# Patient Record
Sex: Female | Born: 1950 | Race: White | Hispanic: No
Health system: Midwestern US, Academic
[De-identification: ages and names within clinical notes are randomized; demographics above are authoritative.]

---

## 2017-10-20 ENCOUNTER — Encounter: Admit: 2017-10-20 | Discharge: 2017-10-20 | Payer: MEDICARE

## 2017-10-20 DIAGNOSIS — R011 Cardiac murmur, unspecified: ICD-10-CM

## 2017-10-20 DIAGNOSIS — M1990 Unspecified osteoarthritis, unspecified site: Secondary | ICD-10-CM

## 2017-10-20 DIAGNOSIS — I96 Gangrene, not elsewhere classified: ICD-10-CM

## 2017-10-20 DIAGNOSIS — M7660 Achilles tendinitis, unspecified leg: ICD-10-CM

## 2017-10-20 DIAGNOSIS — Z8619 Personal history of other infectious and parasitic diseases: ICD-10-CM

## 2017-10-20 DIAGNOSIS — G8929 Other chronic pain: ICD-10-CM

## 2017-10-20 DIAGNOSIS — H6690 Otitis media, unspecified, unspecified ear: ICD-10-CM

## 2017-10-20 DIAGNOSIS — I10 Essential (primary) hypertension: ICD-10-CM

## 2017-10-20 DIAGNOSIS — B059 Measles without complication: ICD-10-CM

## 2017-10-20 DIAGNOSIS — M5430 Sciatica, unspecified side: ICD-10-CM

## 2017-10-20 DIAGNOSIS — T148XXA Other injury of unspecified body region, initial encounter: ICD-10-CM

## 2017-10-20 DIAGNOSIS — Z9109 Other allergy status, other than to drugs and biological substances: ICD-10-CM

## 2017-10-20 DIAGNOSIS — B019 Varicella without complication: ICD-10-CM

## 2017-10-20 DIAGNOSIS — J329 Chronic sinusitis, unspecified: ICD-10-CM

## 2017-10-20 DIAGNOSIS — M25476 Effusion, unspecified foot: ICD-10-CM

## 2017-10-20 DIAGNOSIS — E119 Type 2 diabetes mellitus without complications: ICD-10-CM

## 2017-10-20 DIAGNOSIS — M722 Plantar fascial fibromatosis: ICD-10-CM

## 2017-10-20 DIAGNOSIS — D259 Leiomyoma of uterus, unspecified: ICD-10-CM

## 2017-10-20 DIAGNOSIS — M2550 Pain in unspecified joint: ICD-10-CM

## 2017-10-20 DIAGNOSIS — B269 Mumps without complication: ICD-10-CM

## 2017-10-20 DIAGNOSIS — H6990 Unspecified Eustachian tube disorder, unspecified ear: ICD-10-CM

## 2018-09-01 ENCOUNTER — Encounter: Admit: 2018-09-01 | Discharge: 2018-09-02 | Payer: MEDICARE

## 2018-09-01 ENCOUNTER — Encounter: Admit: 2018-09-01 | Discharge: 2018-09-01 | Payer: MEDICARE

## 2018-09-01 DIAGNOSIS — R69 Illness, unspecified: Principal | ICD-10-CM

## 2018-09-01 MED ORDER — HEPARIN, PORCINE (PF) 5,000 UNIT/0.5 ML IJ SYRG
5000 [IU] | SUBCUTANEOUS | 0 refills | Status: DC
Start: 2018-09-01 — End: 2018-09-03

## 2018-09-02 ENCOUNTER — Encounter: Admit: 2018-09-02 | Discharge: 2018-09-02 | Payer: MEDICARE

## 2018-09-02 DIAGNOSIS — R0789 Other chest pain: Principal | ICD-10-CM

## 2018-09-02 LAB — MAGNESIUM
Lab: 2.1 mg/dL (ref 1.6–2.6)
Lab: 2.2 mg/dL — ABNORMAL LOW (ref 1.6–2.6)

## 2018-09-02 LAB — CBC AND DIFF
Lab: 11 10*3/uL — ABNORMAL HIGH (ref 4.5–11.0)
Lab: 0.1 10*3/uL (ref 0–0.20)
Lab: 0.2 10*3/uL (ref 0–0.45)
Lab: 0.8 10*3/uL (ref 0–0.80)
Lab: 1 % (ref 0–2)
Lab: 10 K/UL (ref 4.5–11.0)
Lab: 2 % (ref 0–5)
Lab: 2.6 10*3/uL (ref 1.0–4.8)
Lab: 6.4 10*3/uL (ref 1.8–7.0)

## 2018-09-02 LAB — BASIC METABOLIC PANEL
Lab: 0.6 mg/dL (ref >60)
Lab: 103 MMOL/L (ref 98–110)
Lab: 138 MMOL/L (ref 137–147)
Lab: 4 MMOL/L (ref 3.5–5.1)
Lab: 9.1 mg/dL (ref >60)
Lab: >60 mL/min (ref >60)
Lab: >60 mL/min (ref >60)

## 2018-09-02 LAB — TROPONIN-I
Lab: 0 ng/mL — ABNORMAL HIGH (ref 0.0–0.05)
Lab: 0 ng/mL (ref 0.0–0.05)

## 2018-09-02 LAB — LIPID PROFILE
Lab: 111 mg/dL — ABNORMAL HIGH (ref <100)
Lab: 123 mg/dL
Lab: 128 mg/dL (ref <150)
Lab: 171 mg/dL (ref <200)
Lab: 26 mg/dL
Lab: 48 mg/dL (ref >40)

## 2018-09-02 LAB — POC GLUCOSE
Lab: 184 mg/dL — ABNORMAL HIGH (ref 70–100)
Lab: 221 mg/dL — ABNORMAL HIGH (ref 70–100)
Lab: 161 mg/dL — ABNORMAL HIGH (ref 70–100)

## 2018-09-02 LAB — COMPREHENSIVE METABOLIC PANEL
Lab: 0.7 mg/dL (ref 0.4–1.00)
Lab: 12 mg/dL (ref 7–25)
Lab: 137 MMOL/L (ref 137–147)

## 2018-09-02 LAB — PROTIME INR (PT): Lab: 1 g/dL (ref 0.8–1.2)

## 2018-09-02 LAB — BNP (B-TYPE NATRIURETIC PEPTI): Lab: 33 pg/mL (ref 0–100)

## 2018-09-02 LAB — HEMOGLOBIN A1C: Lab: 8.9 % — ABNORMAL HIGH (ref 4.0–6.0)

## 2018-09-02 LAB — PTT (APTT): Lab: 25 s (ref 24.0–36.5)

## 2018-09-02 MED ORDER — INSULIN ASPART 100 UNIT/ML SC FLEXPEN
0-6 [IU] | Freq: Before meals | SUBCUTANEOUS | 0 refills | Status: DC
Start: 2018-09-02 — End: 2018-09-03

## 2018-09-02 NOTE — H&P (View-Only)
lunate carpal right wrist   ??? Osteoarthritis    ??? Plantar fasciitis dx age 68    resolved by wrapping and cortisone shots/prednisone   ??? Sciatic pain dx 56    left, tx w/ cortisone shots   ??? Sinus infection age 54    &pneumonia (severe per pt)   ??? Swelling of foot joint age 22    ball of left foot, cortisone shot tx relieved sx     Surgical History:   Procedure Laterality Date   ??? HX TONSILLECTOMY  1953   ??? HX TUBAL LIGATION  1975   ??? CHOLECYSTECTOMY  1998    laprascopic   ??? DILATION AND CURETTAGE  02/2016    x2-3 ( 2 D & C's approx 1970's)     ??? LEFT WRIST ARTHROSCOPY AND DEBRIDEMENT Left 03/16/2016    Performed by Desiree Hane, MD at [facility]   ??? WRIST SURGERY Right age 89    necrosis of lunate carpal, right wrist; ulnar lengthening and removal of plate which caused infection     Family History   Problem Relation Age of Onset   ??? Diabetes Mother    ??? Hypertension Mother    ??? Heart Disease Mother    ??? Diabetes Father    ??? Heart Disease Father    ??? Diabetes Sister    ??? Cancer Sister         Ovarian     Social History     Socioeconomic History   ??? Marital status: Divorced     Spouse name: Not on file   ??? Number of children: Not on file   ??? Years of education: Not on file   ??? Highest education level: Not on file   Occupational History   ??? Not on file   Social Needs   ??? Financial resource strain: Not on file   ??? Food insecurity:     Worry: Not on file     Inability: Not on file   ??? Transportation needs:     Medical: Not on file     Non-medical: Not on file   Tobacco Use   ??? Smoking status: Never Smoker   ??? Smokeless tobacco: Never Used   Substance and Sexual Activity   ??? Alcohol use: No     Alcohol/week: 0.8 standard drinks     Types: 1 Standard drinks or equivalent per week     Comment: 6 drinks a year   ??? Drug use: No   ??? Sexual activity: Not on file   Lifestyle   ??? Physical activity:     Days per week: Not on file     Minutes per session: Not on file   ??? Stress: Not on file   Relationships Calcium 9.1 8.5 - 10.6 MG/DL    Total Protein 7.1 6.0 - 8.0 G/DL    Total Bilirubin 0.5 0.3 - 1.2 MG/DL    Albumin 4.1 3.5 - 5.0 G/DL    Alk Phosphatase 76 25 - 110 U/L    AST (SGOT) 7 7 - 40 U/L    CO2 23 21 - 30 MMOL/L    ALT (SGPT) 9 7 - 56 U/L    Anion Gap 13 (H) 3 - 12    eGFR Non African American >60 >60 mL/min    eGFR African American >60 >60 mL/min   BNP (B-TYPE NATRIURETIC PEPTI)    Collection Time: 09/02/18 12:20 AM   Result Value Ref Range  B Type Natriuretic Peptide 33.0 0 - 100 PG/ML   TROPONIN-I    Collection Time: 09/02/18 12:20 AM   Result Value Ref Range    Troponin-I 0.00 0.0 - 0.05 NG/ML     Glucose: (!) 207 (09/02/18 0020)  Pertinent radiology reviewed., EKG Reviewed    Rosaria Ferries, MD  Pager [phone]

## 2018-09-02 NOTE — Discharge Instructions - Appointments
Please follow-up with Dr. Vashti Hey, MD regarding diet, exercise, cholesterol, blood sugars.

## 2018-09-03 ENCOUNTER — Inpatient Hospital Stay: Admit: 2018-09-02 | Discharge: 2018-09-02 | Payer: MEDICARE

## 2018-09-03 ENCOUNTER — Observation Stay: Admit: 2018-09-02 | Discharge: 2018-09-03 | Payer: MEDICARE

## 2018-09-03 DIAGNOSIS — Z0489 Encounter for examination and observation for other specified reasons: ICD-10-CM

## 2018-09-03 DIAGNOSIS — E785 Hyperlipidemia, unspecified: ICD-10-CM

## 2018-09-03 DIAGNOSIS — R6884 Jaw pain: ICD-10-CM

## 2018-09-03 DIAGNOSIS — I10 Essential (primary) hypertension: ICD-10-CM

## 2018-09-03 DIAGNOSIS — E1165 Type 2 diabetes mellitus with hyperglycemia: ICD-10-CM

## 2018-09-03 DIAGNOSIS — Z9851 Tubal ligation status: ICD-10-CM

## 2018-09-03 DIAGNOSIS — Z8249 Family history of ischemic heart disease and other diseases of the circulatory system: ICD-10-CM

## 2018-09-03 DIAGNOSIS — Z833 Family history of diabetes mellitus: ICD-10-CM

## 2018-09-03 DIAGNOSIS — Z8701 Personal history of pneumonia (recurrent): ICD-10-CM

## 2018-09-03 DIAGNOSIS — Z9049 Acquired absence of other specified parts of digestive tract: ICD-10-CM

## 2018-09-03 DIAGNOSIS — R011 Cardiac murmur, unspecified: ICD-10-CM

## 2018-09-03 DIAGNOSIS — Z8619 Personal history of other infectious and parasitic diseases: ICD-10-CM

## 2018-09-03 DIAGNOSIS — E669 Obesity, unspecified: ICD-10-CM

## 2018-09-03 DIAGNOSIS — Z8041 Family history of malignant neoplasm of ovary: ICD-10-CM

## 2018-09-03 DIAGNOSIS — R0789 Other chest pain: Principal | ICD-10-CM

## 2018-09-03 DIAGNOSIS — Z6841 Body Mass Index (BMI) 40.0 and over, adult: ICD-10-CM

## 2018-09-03 NOTE — Discharge Instructions - Pharmacy
ball of left foot, cortisone shot tx relieved sx         Allergies: Prilosec [omeprazole]; Aspirin; Micardis [telmisartan]; Nsaids (non-steroidal anti-inflammatory drug); Other [unclassified drug]; and Yellow (food color)    Admission Physical Exam notable for:   Physical Exam:  Vital Signs: Last Filed In 24 Hours Vital Signs: 24 Hour Range   BP: 152/70 (02/14 2243)  Temp: 37 ???C (98.6 ???F) (02/14 2243)  Pulse: 77 (02/14 2243)  Respirations: 18 PER MINUTE (02/14 2243)  SpO2: 96 % (02/14 2243)  Height: 157.5 cm (62) (02/14 2243) BP: (152)/(70)   Temp:  [37 ???C (98.6 ???F)]   Pulse:  [77]   Respirations:  [18 PER MINUTE]   SpO2:  [96 %]     ???   ???  General:  Alert, cooperative, no distress, appears stated age and obese appearing  Head:  Normocephalic, without obvious abnormality, atraumatic  Eyes:  Conjunctivae/corneas clear.  PERRL, EOMs intact.  Fundi benign  Throat:  Lips, mucosa and tongue normal.  Teeth and gums normal  Neck:  Supple, symmetrical, trachea midline, no adenopathy, thyroid: no enlargement/tenderness/nodules, no carotid bruit and no JVD  Back:  Symmetric, no curvature, ROM normal.  No CVA tenderness.  Lungs:  Clear to auscultation bilaterally  Chest wall:  No tenderness or deformity.  Heart:    RRR, Normal S1 with systolic murmur 2/6, Normal S2  Abdomen:  Soft, non-tender.  Bowel sounds normal.  No masses.  No organomegaly.  Extremities:  Extremities normal, atraumatic, no cyanosis or edema  Peripheral pulses:   2+ and symmetric, all extremities        Admission Lab/Radiology studies notable for:   Lab/Radiology/Other Diagnostic Tests:  24-hour labs:          Results for orders placed or performed during the hospital encounter of 09/01/18 (from the past 24 hour(s))   CBC AND DIFF   ??? Collection Time: 09/02/18 12:20 AM   Result Value Ref Range   ??? White Blood Cells 11.1 (H) 4.5 - 11.0 K/UL   ??? RBC 4.19 4.0 - 5.0 M/UL   ??? Hemoglobin 13.1 12.0 - 15.0 GM/DL   ??? Hematocrit 39.0 36 - 45 % LDL (bad cholesterol): secondary prevention < 70  HDL (good cholesterol): >40 for men, >50 for women  Triglycerides: <150  Your Numbers: Cholesterol       Date                     Value               Ref Range           Status                09/02/2018               171                 <200 MG/DL          Final            ----------  LDL       Date                     Value               Ref Range           Status  09/02/2018               111 (H)             <100 mg/dL          Final            ----------  HDL       Date                     Value               Ref Range           Status                09/02/2018               48                  >40 MG/DL           Final            ----------  Triglycerides       Date                     Value               Ref Range           Status                09/02/2018               128                 <150 MG/DL          Final            ----------  Plan: Diets high in saturated fat, trans fat and cholesterol can raise blood cholesterol levels increasing your risk of having a cardiac event.  Take medication as prescribed and eat a heart healthy, low sodium diet.    Smoking Risk   Goals: If you smoke, STOP!   Plan: Smoking DOUBLES your risk of having a cardiac event. Quitting can greatly reduce your risk. To register for smoking cessation program call [phone] or visit www.smokefree.gov    Diabetes Risk   Goal: Non-diabetic: Below 5.7%  Goal for diabetic: Less than 7%  Your Numbers: Hemoglobin A1C       Date                     Value               Ref Range           Status                09/02/2018               8.9 (H)             4.0 - 6.0 %         Final              Comment:    The ADA recommends that most patients with type 1 and type 2 diabetes maintain     an A1c level <7%.      ----------  Plan: If you have diabetes, even if treated, you are at an increased risk of having a cardiac event.     Alcohol Use Risk

## 2018-09-04 ENCOUNTER — Encounter: Admit: 2018-09-04 | Discharge: 2018-09-04 | Payer: MEDICARE

## 2018-09-14 ENCOUNTER — Encounter: Admit: 2018-09-14 | Discharge: 2018-09-14 | Payer: MEDICARE

## 2018-10-04 ENCOUNTER — Encounter: Admit: 2018-10-04 | Discharge: 2018-10-04 | Payer: MEDICARE

## 2018-10-04 DIAGNOSIS — R69 Illness, unspecified: Principal | ICD-10-CM

## 2019-09-19 IMAGING — CT CT pelvis wo con
2 of 3 series · 14 of 32 positions shown, 19 images · non-contrast
Comparison: none

[Series 103: std · axial · 0.74mm/px · z∈[-35,+132]mm · 6 of 77 slices shown, 11 images]
[im 11/77  soft-tissue]
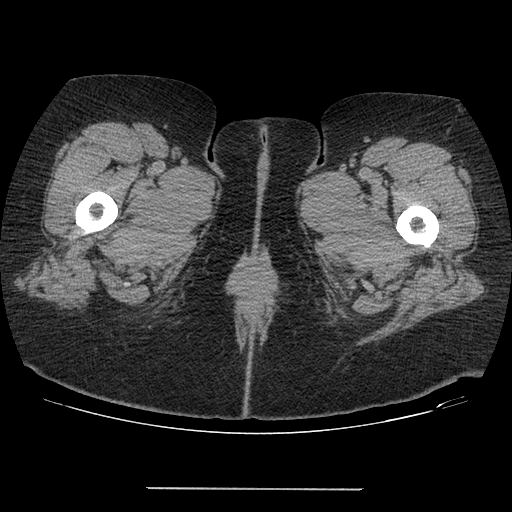
[im 11/77  bone]
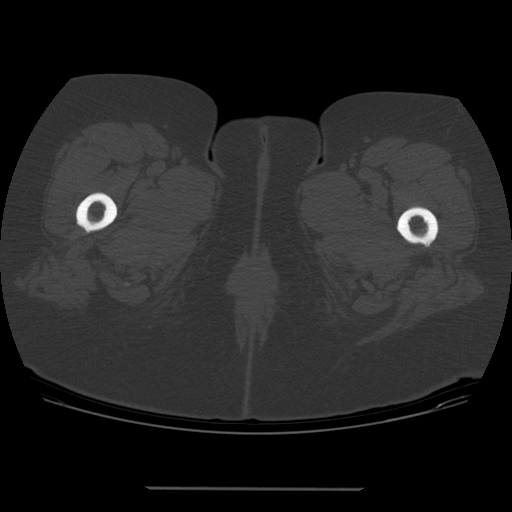
[im 22/77  soft-tissue]
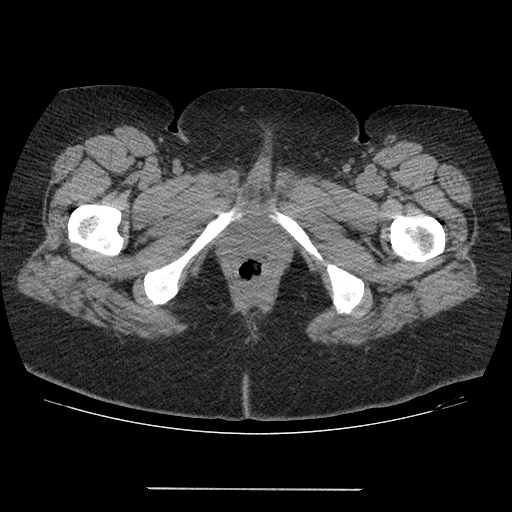
[im 33/77  soft-tissue]
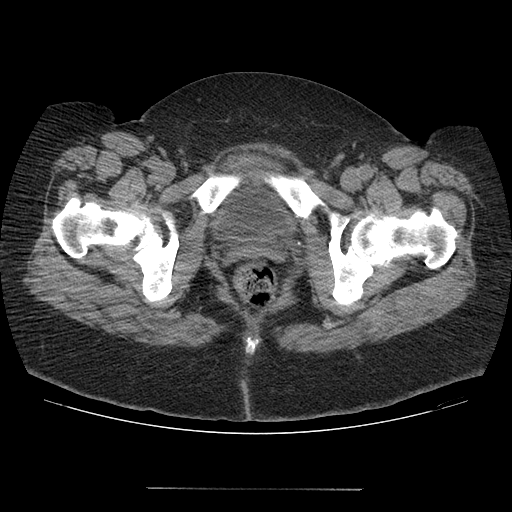
[im 33/77  lung]
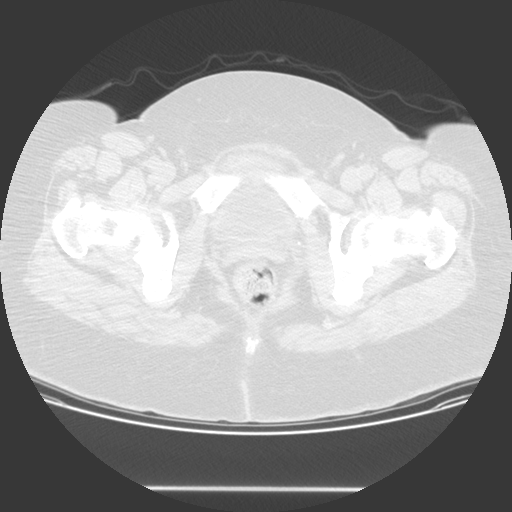
[im 44/77  soft-tissue]
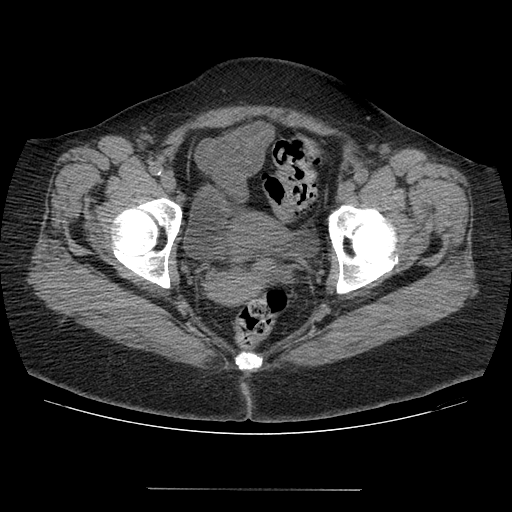
[im 44/77  lung]
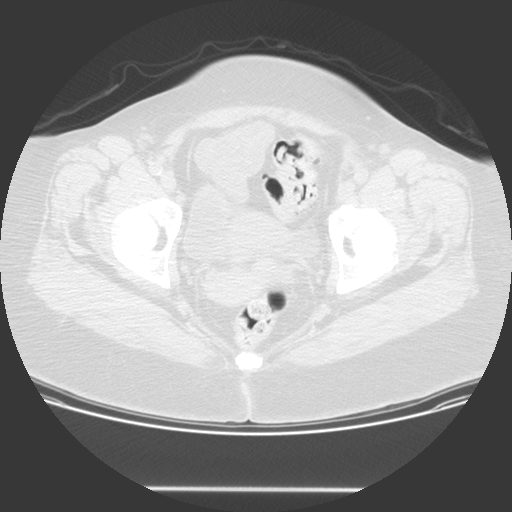
[im 55/77  soft-tissue]
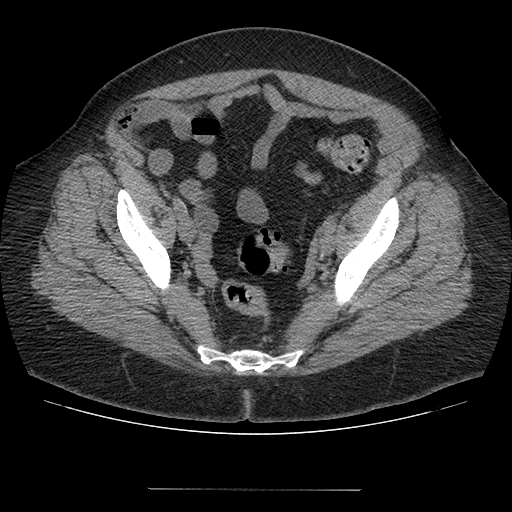
[im 55/77  lung]
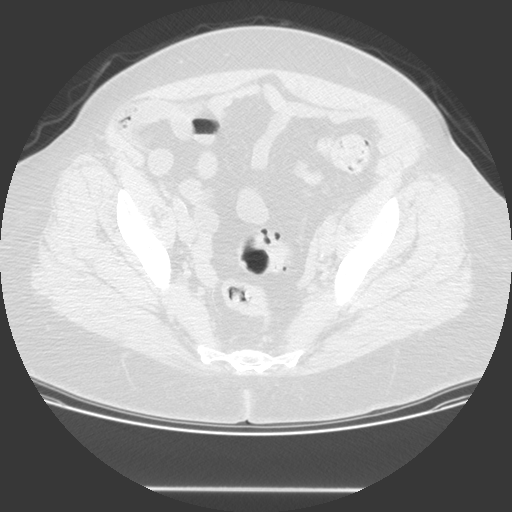
[im 66/77  soft-tissue]
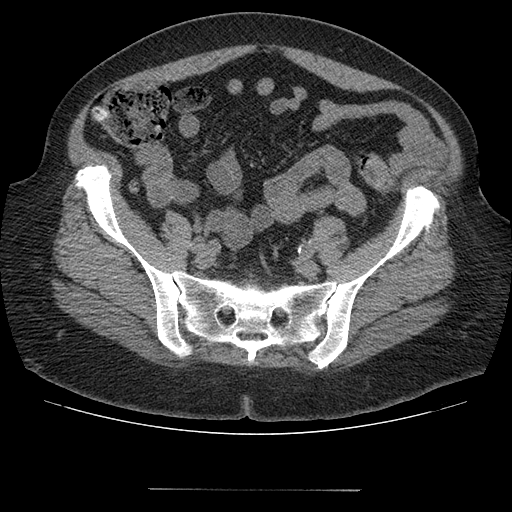
[im 66/77  lung]
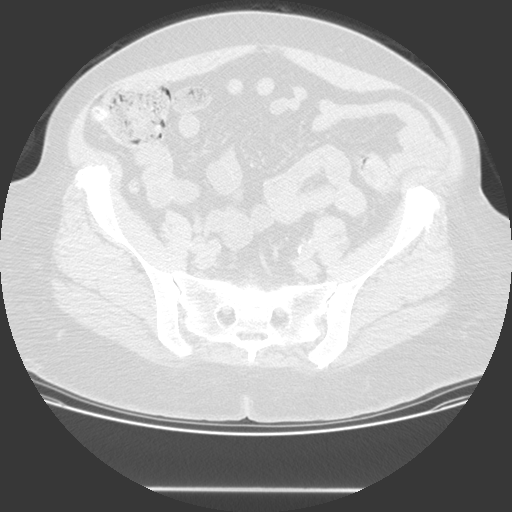

[Series 105: sagittals · sagittal · 0.74mm/px · 8 of 125 slices shown]
[im 11/125  soft-tissue]
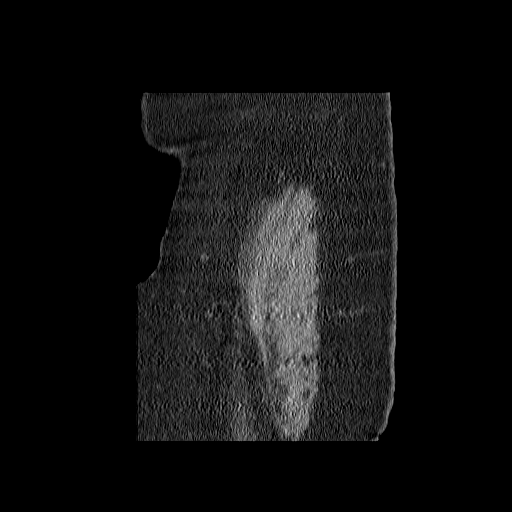
[im 32/125  soft-tissue]
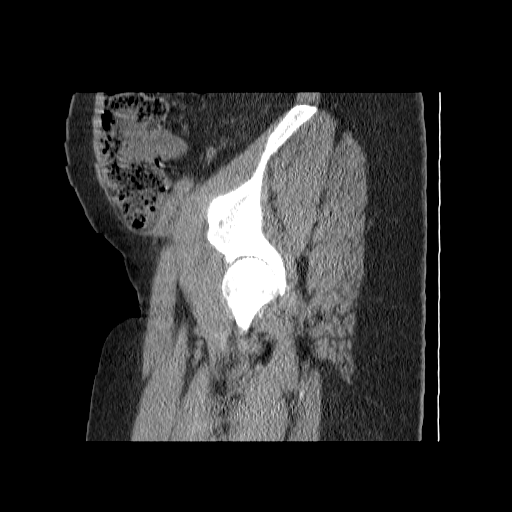
[im 42/125  soft-tissue]
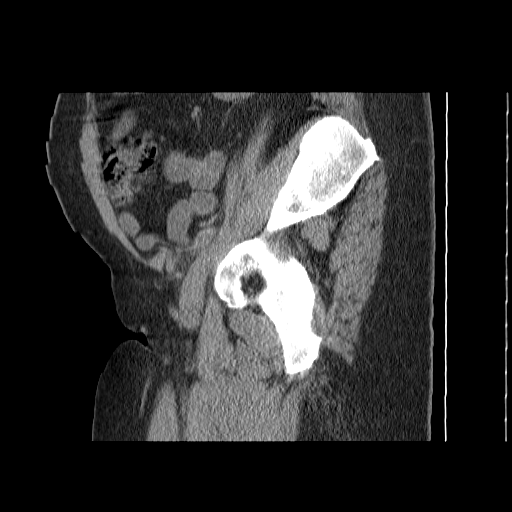
[im 52/125  soft-tissue]
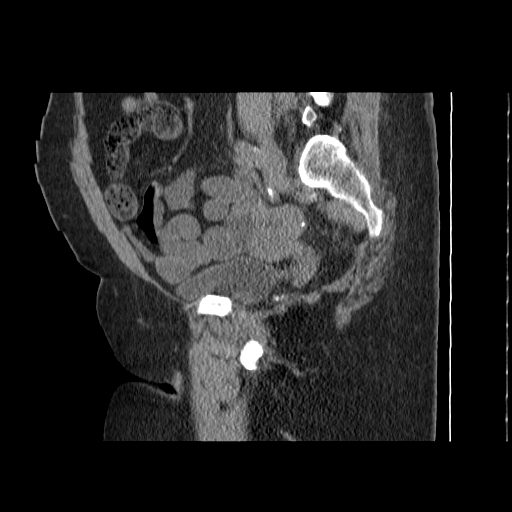
[im 73/125  soft-tissue]
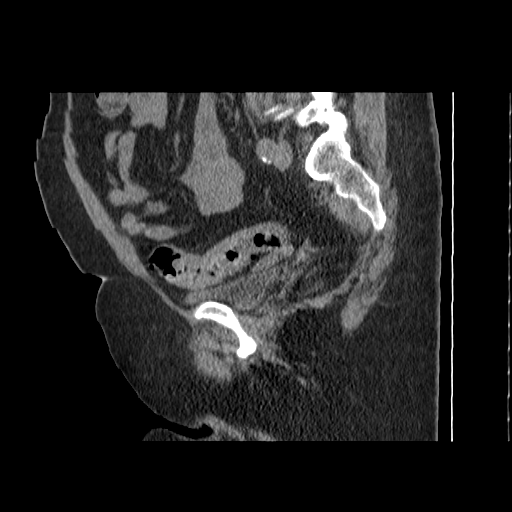
[im 83/125  soft-tissue]
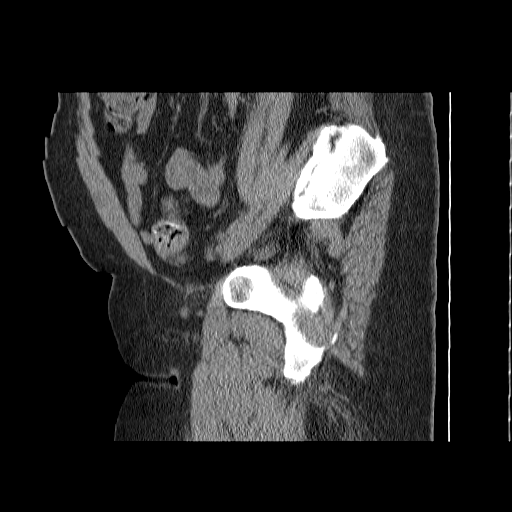
[im 94/125  soft-tissue]
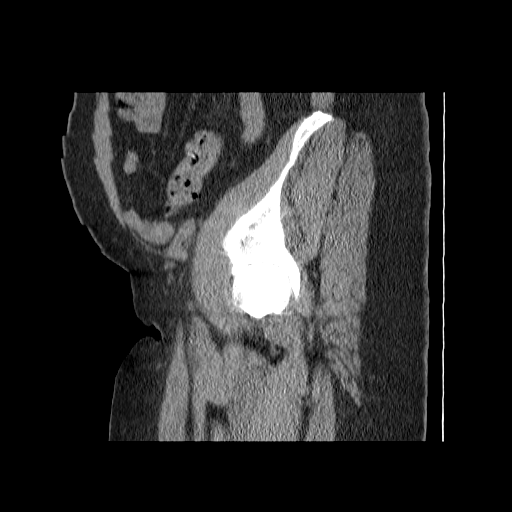
[im 114/125  soft-tissue]
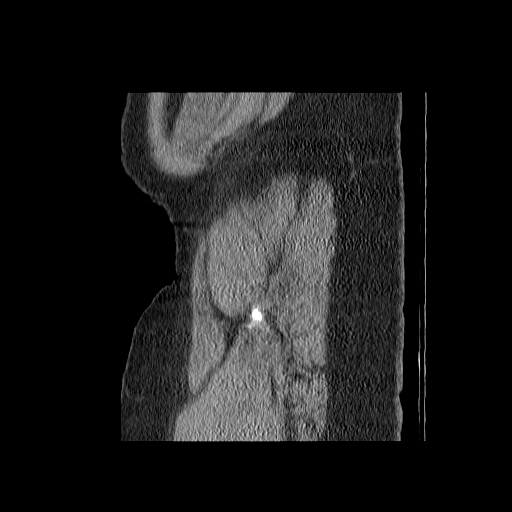

[14 of 32 positions shown; findings below may reference images not displayed]

04/11/18

[All CT scans at this facility use dose modulation, iterative reconstruction, and/or weight based
dosing when appropriate to reduce radiation dose to as low as reasonably achievable.]

EXAM
CT of the pelvis without contrast.

INDICATION
Tail bone pain.

FINDINGS
The bone density is maintained. There is no destruction. There is no displaced fracture.
Degenerative changes of the lower lumbar discs are noted. There also degenerative changes of the
hips.

IMPRESSION
No destruction or fracture is identified.
Degenerative changes are noted as above.

Tech Notes:

pt. c/o posterior pelvic pain. sacrum/coccyx pain.

## 2020-02-09 IMAGING — CR CHEST
1 series · 1 of 1 positions shown · non-contrast
Comparison: none

[chest port x-wise]
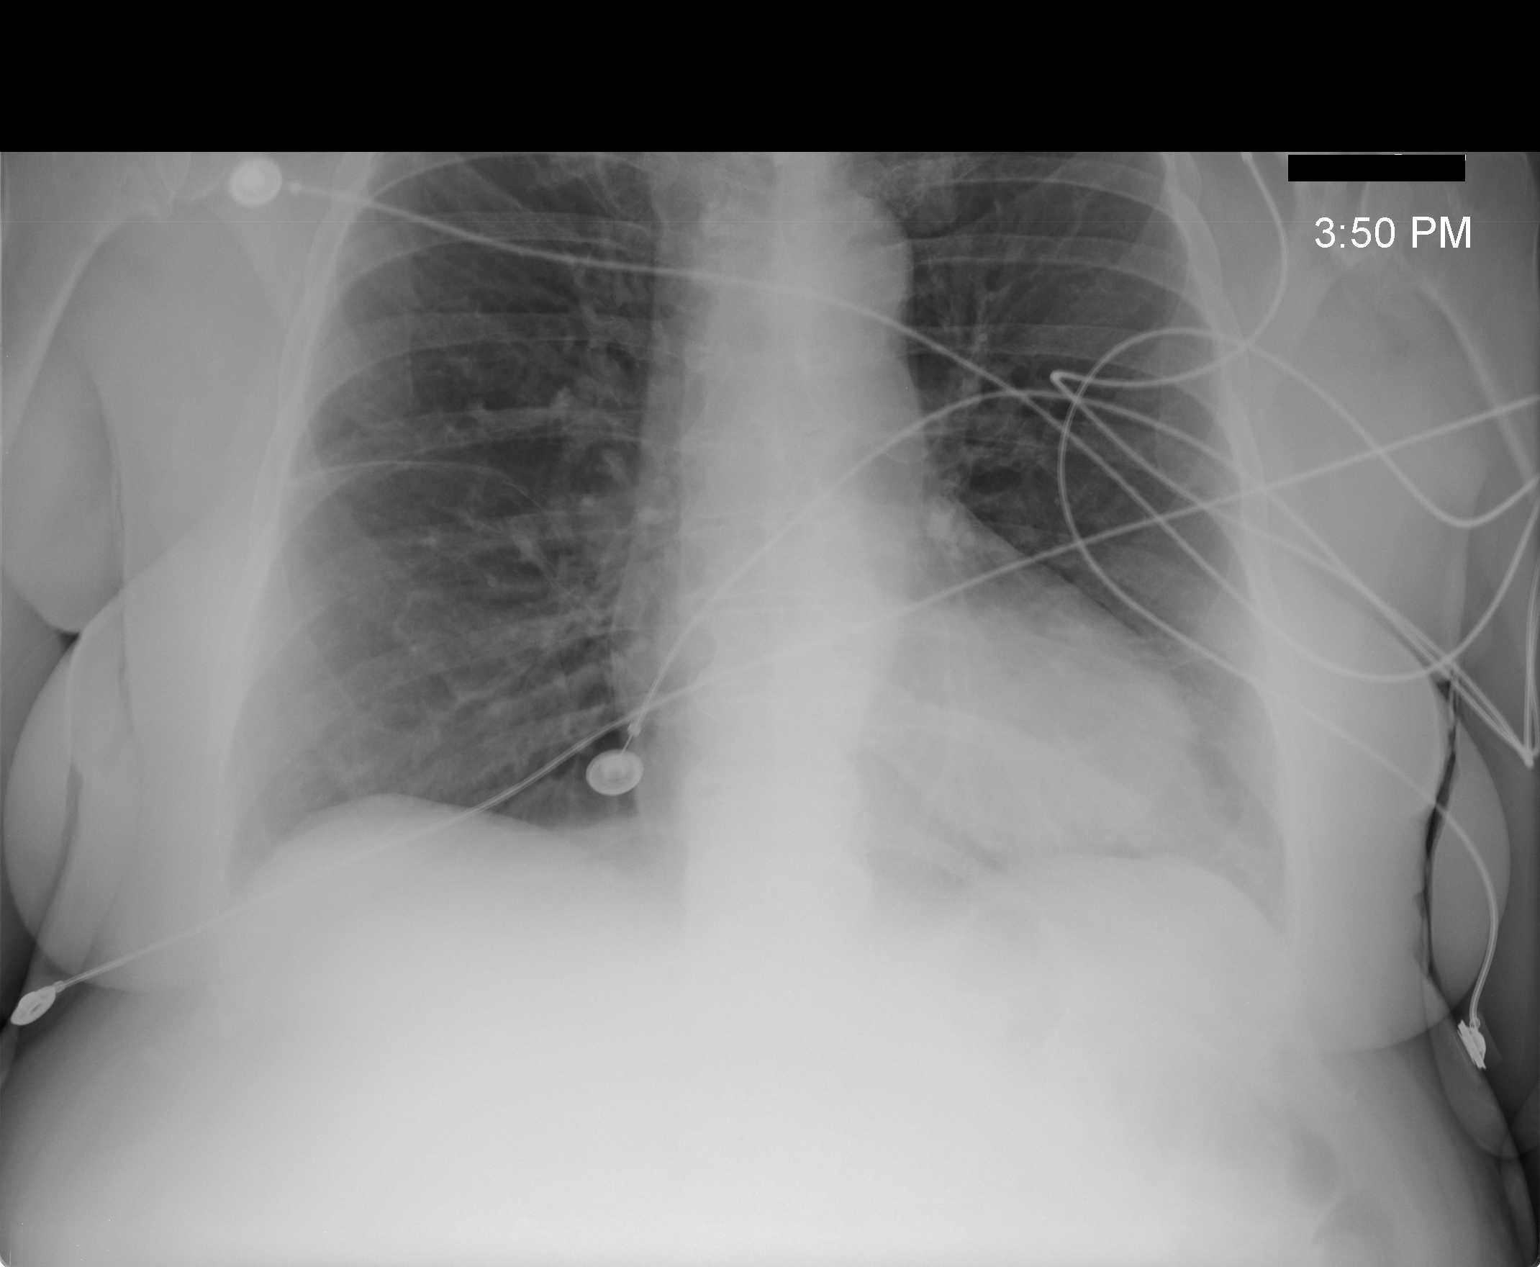

[1 of 1 positions shown; findings below may reference images not displayed]

09/01/18

DIAGNOSTIC STUDIES

EXAM

Chest radiograph.

INDICATION

chest pain
Head fullness, chest pain.  BG

TECHNIQUE

AP chest.

COMPARISONS

December 02, 2016

FINDINGS

There is no consolidative airspace disease, pleural effusion, or pneumothorax. The
cardiomediastinal silhouette is not widened. There is mild aortic atherosclerosis.

IMPRESSION

No acute cardiopulmonary pathology.

Tech Notes:

Head fullness, chest pain.
BG

## 2022-10-19 IMAGING — CR PAPERORDER
2 series · 2 of 2 positions shown · non-contrast
Comparison: none

[chest pa]
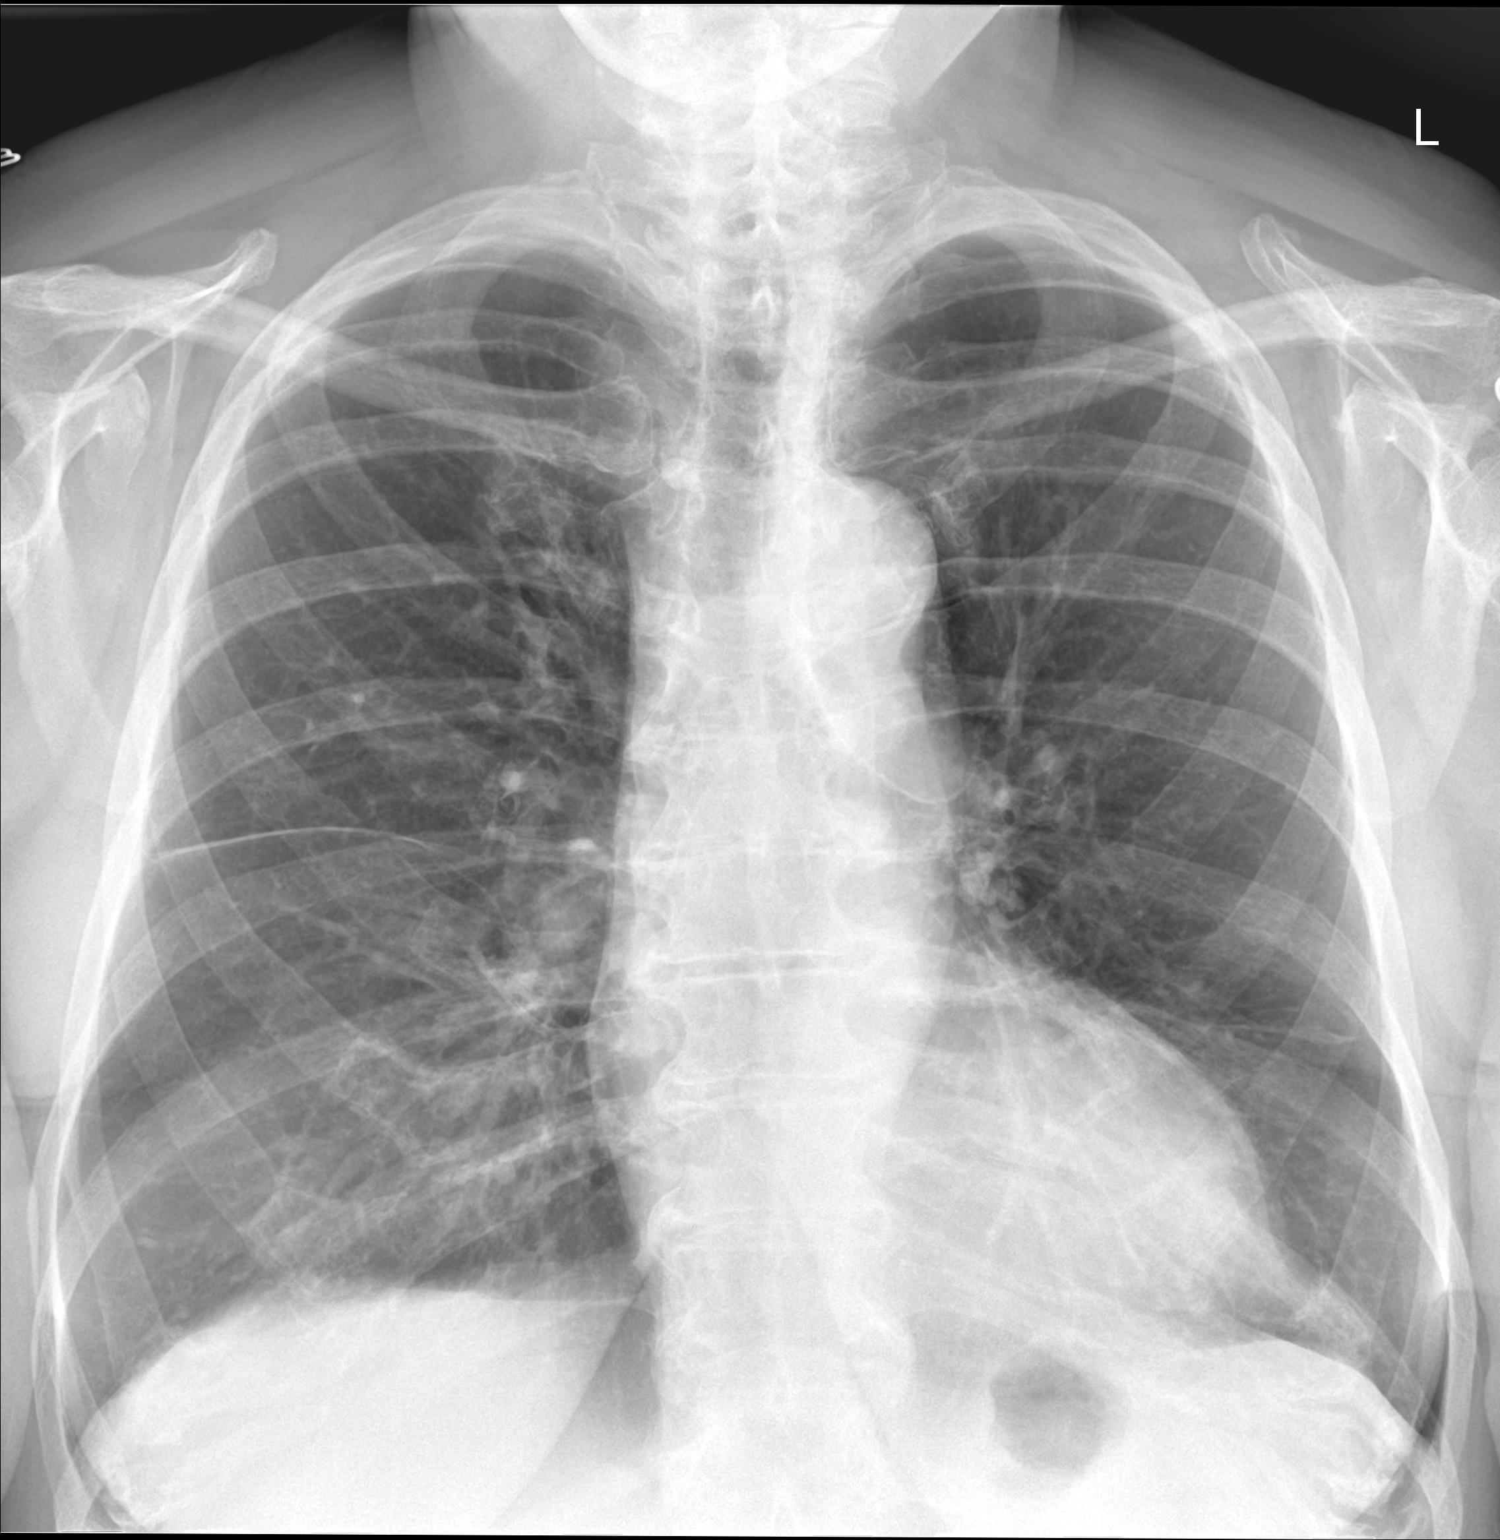

[chest lat]
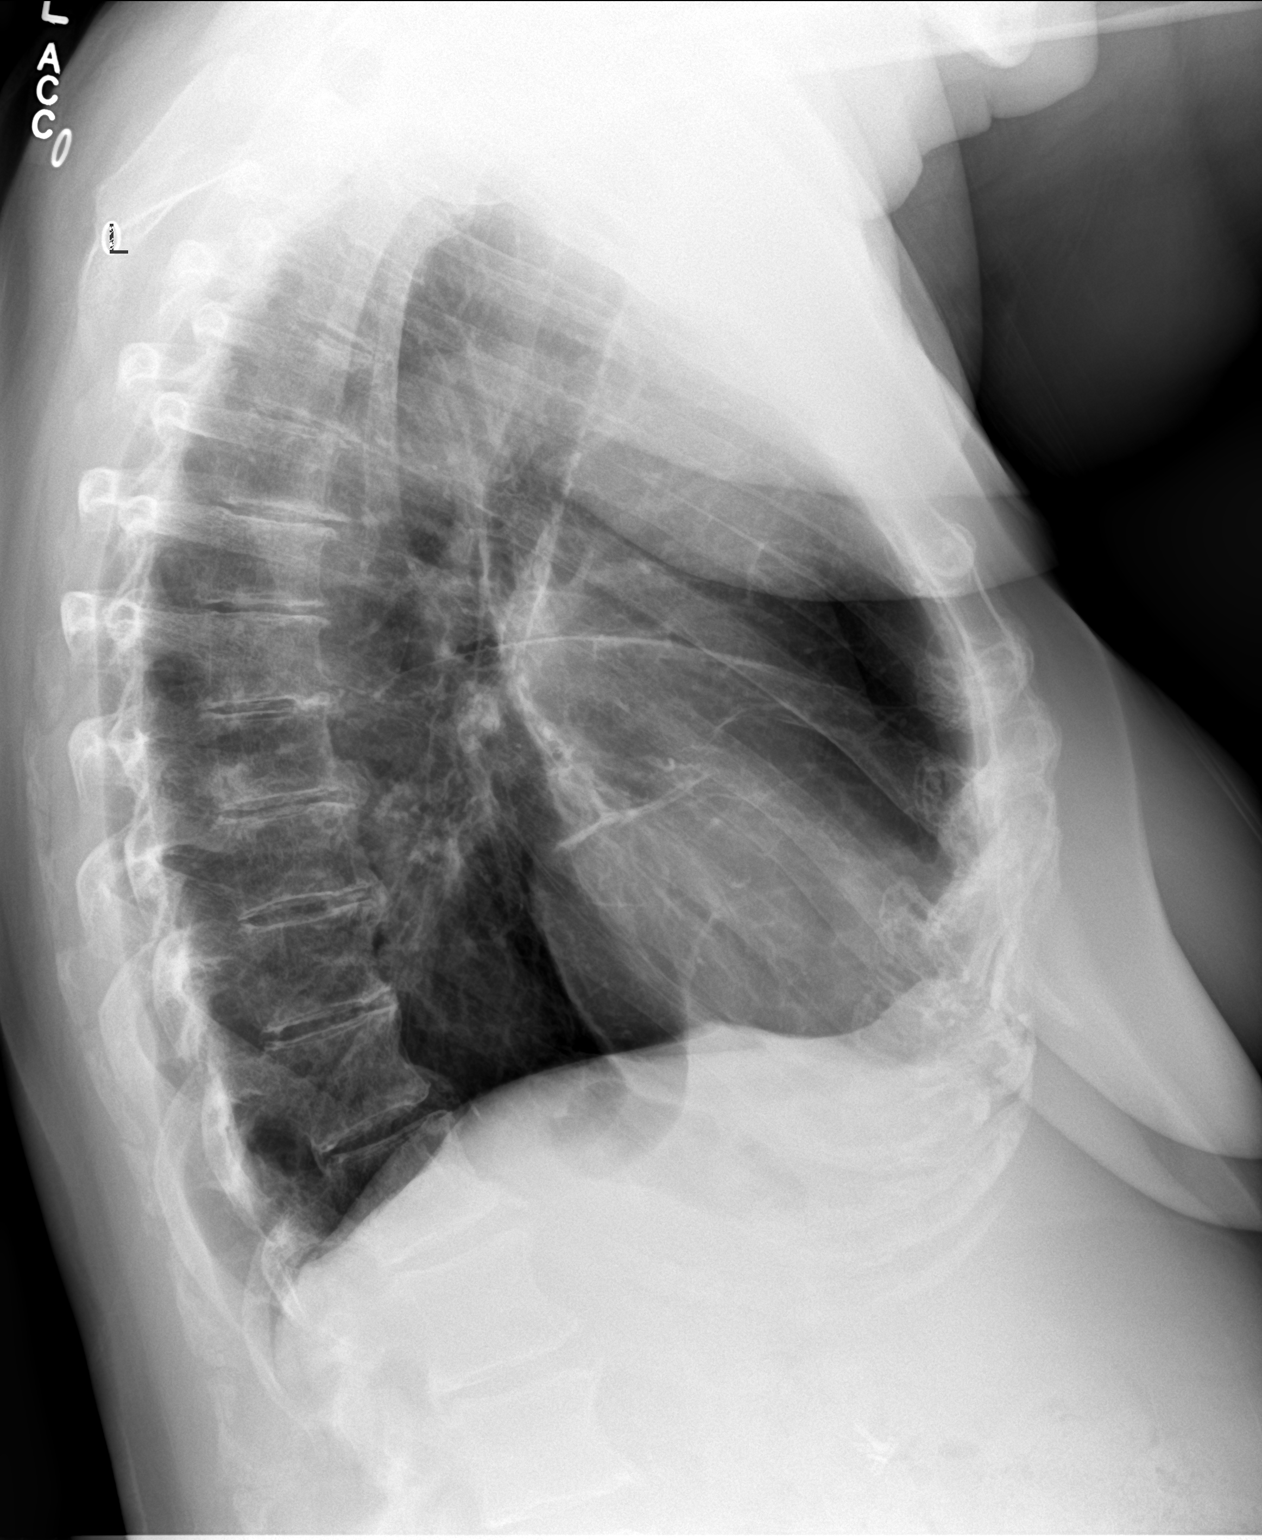

[2 of 2 positions shown; findings below may reference images not displayed]

05/11/21

Ordering:    Stricklin, [place]

DIAGNOSTIC STUDIES

EXAM

XR chest 2V

INDICATION

Endometrial intraepithelial neoplasia OD KOZJEG MLIJEKA
Oyamburo assessment, chronic cough.

TECHNIQUE

Two views

COMPARISONS

September 01, 2018

FINDINGS

Coarse interstitial markings. No focal pneumonia, pleural effusion, or pneumothorax. The heart size
is normal. Degenerative changes of the spine.

IMPRESSION

No acute cardiopulmonary findings. Chronic interstitial changes.

Tech Notes:

Pre-op assessment, chronic cough.
